# Patient Record
Sex: Male | Born: 1955 | Race: White | Hispanic: No | Marital: Married | Smoking: Current every day smoker
Health system: Southern US, Community
[De-identification: ages and names within clinical notes are randomized; demographics above are authoritative.]

## PROBLEM LIST (undated history)

## (undated) DIAGNOSIS — I2510 Atherosclerotic heart disease of native coronary artery without angina pectoris: Secondary | ICD-10-CM

## (undated) DIAGNOSIS — I219 Acute myocardial infarction, unspecified: Secondary | ICD-10-CM

## (undated) HISTORY — PX: CORONARY ANGIOPLASTY WITH STENT PLACEMENT: SHX49

---

## 2013-09-13 ENCOUNTER — Emergency Department (HOSPITAL_COMMUNITY): Payer: No Typology Code available for payment source

## 2013-09-13 ENCOUNTER — Emergency Department (HOSPITAL_COMMUNITY)
Admission: EM | Admit: 2013-09-13 | Discharge: 2013-09-13 | Disposition: A | Discharge status: Home / Self Care | Payer: No Typology Code available for payment source | Attending: Emergency Medicine | Admitting: Emergency Medicine

## 2013-09-13 ENCOUNTER — Encounter (HOSPITAL_COMMUNITY): Payer: Self-pay | Admitting: Emergency Medicine

## 2013-09-13 DIAGNOSIS — S40021A Contusion of right upper arm, initial encounter: Secondary | ICD-10-CM

## 2013-09-13 DIAGNOSIS — Z9861 Coronary angioplasty status: Secondary | ICD-10-CM | POA: Insufficient documentation

## 2013-09-13 DIAGNOSIS — Z79899 Other long term (current) drug therapy: Secondary | ICD-10-CM | POA: Insufficient documentation

## 2013-09-13 DIAGNOSIS — S161XXA Strain of muscle, fascia and tendon at neck level, initial encounter: Secondary | ICD-10-CM

## 2013-09-13 DIAGNOSIS — Y92410 Unspecified street and highway as the place of occurrence of the external cause: Secondary | ICD-10-CM | POA: Insufficient documentation

## 2013-09-13 DIAGNOSIS — I2510 Atherosclerotic heart disease of native coronary artery without angina pectoris: Secondary | ICD-10-CM | POA: Insufficient documentation

## 2013-09-13 DIAGNOSIS — S139XXA Sprain of joints and ligaments of unspecified parts of neck, initial encounter: Secondary | ICD-10-CM | POA: Insufficient documentation

## 2013-09-13 DIAGNOSIS — Z7982 Long term (current) use of aspirin: Secondary | ICD-10-CM | POA: Insufficient documentation

## 2013-09-13 DIAGNOSIS — S40029A Contusion of unspecified upper arm, initial encounter: Secondary | ICD-10-CM | POA: Insufficient documentation

## 2013-09-13 DIAGNOSIS — Y9389 Activity, other specified: Secondary | ICD-10-CM | POA: Insufficient documentation

## 2013-09-13 DIAGNOSIS — F17200 Nicotine dependence, unspecified, uncomplicated: Secondary | ICD-10-CM | POA: Insufficient documentation

## 2013-09-13 DIAGNOSIS — I252 Old myocardial infarction: Secondary | ICD-10-CM | POA: Insufficient documentation

## 2013-09-13 HISTORY — DX: Atherosclerotic heart disease of native coronary artery without angina pectoris: I25.10

## 2013-09-13 HISTORY — DX: Acute myocardial infarction, unspecified: I21.9

## 2013-09-13 MED ORDER — IBUPROFEN 800 MG PO TABS: 800.0000 mg | ORAL_TABLET | Freq: Three times a day (TID) | ORAL | Status: AC | PRN

## 2013-09-13 MED ORDER — HYDROCODONE-ACETAMINOPHEN 5-325 MG PO TABS: 1.0000 | ORAL_TABLET | Freq: Four times a day (QID) | ORAL | Status: AC | PRN

## 2013-09-13 MED ORDER — HYDROCODONE-ACETAMINOPHEN 5-325 MG PO TABS
1.0000 | ORAL_TABLET | Freq: Once | ORAL | Status: AC
Start: 2013-09-13 — End: 2013-09-13
  Administered 2013-09-13: 1 via ORAL
  Filled 2013-09-13: qty 1
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
## 2013-09-13 NOTE — Discharge Instructions (Signed)
Your x-rays were normal.  Use ice and heat on the area.  Followup with a primary care Dr. or urgent care

## 2013-09-18 NOTE — ED Provider Notes (Signed)
Medical screening examination/treatment/procedure(s) were performed by non-physician practitioner and as supervising physician I was immediately available for consultation/collaboration.   EKG Interpretation None        Candyce Churn III, MD 09/18/13 [phone]

## 2014-02-02 DIAGNOSIS — I219 Acute myocardial infarction, unspecified: Secondary | ICD-10-CM

## 2014-02-02 HISTORY — DX: Acute myocardial infarction, unspecified: I21.9

## 2014-08-17 ENCOUNTER — Telehealth: Payer: Self-pay

## 2014-08-17 NOTE — Telephone Encounter (Signed)
Patient is coming in on Saturday - [facility] -  Needs med refills  [phone]

## 2014-08-17 NOTE — Telephone Encounter (Signed)
Spoke with pt's wife and advised we cannot give refills on his medications until he is seen. Pt's wife understood.
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
